# Patient Record
Sex: Male | Born: 1980 | Hispanic: Yes | Marital: Single | Smoking: Never smoker
Health system: Southern US, Community
[De-identification: ages and names within clinical notes are randomized; demographics above are authoritative.]

## PROBLEM LIST (undated history)

## (undated) DIAGNOSIS — K759 Inflammatory liver disease, unspecified: Secondary | ICD-10-CM

## (undated) DIAGNOSIS — K760 Fatty (change of) liver, not elsewhere classified: Secondary | ICD-10-CM

## (undated) HISTORY — DX: Fatty (change of) liver, not elsewhere classified: K76.0

## (undated) HISTORY — DX: Inflammatory liver disease, unspecified: K75.9

## (undated) HISTORY — PX: NO PAST SURGERIES: SHX2092

---

## 2020-02-03 ENCOUNTER — Ambulatory Visit: Payer: Self-pay | Attending: Internal Medicine

## 2020-02-03 DIAGNOSIS — Z23 Encounter for immunization: Secondary | ICD-10-CM

## 2020-02-03 NOTE — Progress Notes (Signed)
   Covid-19 Vaccination Clinic  Name:  Martin Hutchinson    MRN: [id number] DOB: Mar 06, 1981  02/03/2020  [name] was observed post Covid-19 immunization for 15 minutes without incident. He was provided with Vaccine Information Sheet and instruction to access the V-Safe system.   [name] was instructed to call 911 with any severe reactions post vaccine: [name] Kitchen Difficulty breathing  . Swelling of face and throat  . A fast heartbeat  . A bad rash all over body  . Dizziness and weakness   Immunizations Administered    Name Date Dose VIS Date Route   Pfizer COVID-19 Vaccine 02/03/2020 10:01 AM 0.3 mL 10/07/2019 Intramuscular   Manufacturer: [employer], [employer]   Lot: [id number]   NDC: [address]-7

## 2020-02-23 ENCOUNTER — Ambulatory Visit: Payer: Managed Care, Other (non HMO) | Admitting: Internal Medicine

## 2020-02-23 ENCOUNTER — Encounter: Payer: Self-pay | Admitting: Internal Medicine

## 2020-02-23 VITALS — BP 120/80 | HR 97 | Temp 98.1°F | Ht 66.0 in | Wt 152.4 lb

## 2020-02-23 DIAGNOSIS — Z0000 Encounter for general adult medical examination without abnormal findings: Secondary | ICD-10-CM | POA: Diagnosis not present

## 2020-02-23 NOTE — Patient Instructions (Signed)
-Nice seeing you today!!  -Return fasting for your lab work.  -Schedule follow up in 1 year or sooner as needed.   Preventive Care 97-41 Years Old, Male Preventive care refers to lifestyle choices and visits with your health care provider that can promote health and wellness. This includes:  A yearly physical exam. This is also called an annual well check.  Regular dental and eye exams.  Immunizations.  Screening for certain conditions.  Healthy lifestyle choices, such as eating a healthy diet, getting regular exercise, not using drugs or products that contain nicotine and tobacco, and limiting alcohol use. What can I expect for my preventive care visit? Physical exam Your health care provider will check:  Height and weight. These may be used to calculate body mass index (BMI), which is a measurement that tells if you are at a healthy weight.  Heart rate and blood pressure.  Your skin for abnormal spots. Counseling Your health care provider may ask you questions about:  Alcohol, tobacco, and drug use.  Emotional well-being.  Home and relationship well-being.  Sexual activity.  Eating habits.  Work and work Statistician. What immunizations do I need?  Influenza (flu) vaccine  This is recommended every year. Tetanus, diphtheria, and pertussis (Tdap) vaccine  You may need a Td booster every 10 years. Varicella (chickenpox) vaccine  You may need this vaccine if you have not already been vaccinated. Zoster (shingles) vaccine  You may need this after age 8. Measles, mumps, and rubella (MMR) vaccine  You may need at least one dose of MMR if you were born in 1957 or later. You may also need a second dose. Pneumococcal conjugate (PCV13) vaccine  You may need this if you have certain conditions and were not previously vaccinated. Pneumococcal polysaccharide (PPSV23) vaccine  You may need one or two doses if you smoke cigarettes or if you have certain  conditions. Meningococcal conjugate (MenACWY) vaccine  You may need this if you have certain conditions. Hepatitis A vaccine  You may need this if you have certain conditions or if you travel or work in places where you may be exposed to hepatitis A. Hepatitis B vaccine  You may need this if you have certain conditions or if you travel or work in places where you may be exposed to hepatitis B. Haemophilus influenzae type b (Hib) vaccine  You may need this if you have certain risk factors. Human papillomavirus (HPV) vaccine  If recommended by your health care provider, you may need three doses over 6 months. You may receive vaccines as individual doses or as more than one vaccine together in one shot (combination vaccines). Talk with your health care provider about the risks and benefits of combination vaccines. What tests do I need? Blood tests  Lipid and cholesterol levels. These may be checked every 5 years, or more frequently if you are over 44 years old.  Hepatitis C test.  Hepatitis B test. Screening  Lung cancer screening. You may have this screening every year starting at age 34 if you have a 30-pack-year history of smoking and currently smoke or have quit within the past 15 years.  Prostate cancer screening. Recommendations will vary depending on your family history and other risks.  Colorectal cancer screening. All adults should have this screening starting at age 70 and continuing until age 69. Your health care provider may recommend screening at age 73 if you are at increased risk. You will have tests every 1-10 years, depending on your  results and the type of screening test.  Diabetes screening. This is done by checking your blood sugar (glucose) after you have not eaten for a while (fasting). You may have this done every 1-3 years.  Sexually transmitted disease (STD) testing. Follow these instructions at home: Eating and drinking  Eat a diet that includes fresh  fruits and vegetables, whole grains, lean protein, and low-fat dairy products.  Take vitamin and mineral supplements as recommended by your health care provider.  Do not drink alcohol if your health care provider tells you not to drink.  If you drink alcohol: ? Limit how much you have to 0-2 drinks a day. ? Be aware of how much alcohol is in your drink. In the [place] one drink equals one 12 oz bottle of beer (355 mL), one 5 oz glass of wine (148 mL), or one 1 oz glass of hard liquor (44 mL). Lifestyle  Take daily care of your teeth and gums.  Stay active. Exercise for at least 30 minutes on 5 or more days each week.  Do not use any products that contain nicotine or tobacco, such as cigarettes, e-cigarettes, and chewing tobacco. If you need help quitting, ask your health care provider.  If you are sexually active, practice safe sex. Use a condom or other form of protection to prevent STIs (sexually transmitted infections).  Talk with your health care provider about taking a low-dose aspirin every day starting at age 76. What's next?  Go to your health care provider once a year for a well check visit.  Ask your health care provider how often you should have your eyes and teeth checked.  Stay up to date on all vaccines. This information is not intended to replace advice given to you by your health care provider. Make sure you discuss any questions you have with your health care provider. Document Revised: 10/07/2018 Document Reviewed: 10/07/2018 Elsevier Patient Education  2020 [employer].

## 2020-02-23 NOTE — Progress Notes (Signed)
New Patient Office Visit     This visit occurred during the SARS-CoV-2 public health emergency.  Safety protocols were in place, including screening questions prior to the visit, additional usage of staff PPE, and extensive cleaning of exam room while observing appropriate contact time as indicated for disinfecting solutions.    CC/Reason for Visit: Establish care, annual preventive exam Previous PCP: None Last Visit: Unknown  HPI: Martin Hutchinson is a 39 y.o. male who is coming in today for the above mentioned reasons.  He has no past medical history of significance.  He moved from [place] 2 years ago.  He works as an Licensed conveyancer.  He does not smoke, he does not drink, he does not have any known drug allergies, he has not had any past surgeries, does not take any medications, has not been diagnosed with any medical illnesses.  His family history significant for mother unfortunately died from multiple myeloma last year.  He received his first Covid vaccine.  He had a tetanus in 2018.  Has never had a colonoscopy.   Past Medical/Surgical History: History reviewed. No pertinent past medical history.  History reviewed. No pertinent surgical history.  Social History:  reports that he has never smoked. He has never used smokeless tobacco. He reports previous alcohol use. He reports that he does not use drugs.  Allergies: No Known Allergies  Family History:  Family History  Problem Relation Age of Onset  . Multiple myeloma Mother     No current outpatient medications on file.  Review of Systems:  Constitutional: Denies fever, chills, diaphoresis, appetite change and fatigue.  HEENT: Denies photophobia, eye pain, redness, hearing loss, ear pain, congestion, sore throat, rhinorrhea, sneezing, mouth sores, trouble swallowing, neck pain, neck stiffness and tinnitus.   Respiratory: Denies SOB, DOE, cough, chest tightness,  and wheezing.   Cardiovascular: Denies chest pain,  palpitations and leg swelling.  Gastrointestinal: Denies nausea, vomiting, abdominal pain, diarrhea, constipation, blood in stool and abdominal distention.  Genitourinary: Denies dysuria, urgency, frequency, hematuria, flank pain and difficulty urinating.  Endocrine: Denies: hot or cold intolerance, sweats, changes in hair or nails, polyuria, polydipsia. Musculoskeletal: Denies myalgias, back pain, joint swelling, arthralgias and gait problem.  Skin: Denies pallor, rash and wound.  Neurological: Denies dizziness, seizures, syncope, weakness, light-headedness, numbness and headaches.  Hematological: Denies adenopathy. Easy bruising, personal or family bleeding history  Psychiatric/Behavioral: Denies suicidal ideation, mood changes, confusion, nervousness, sleep disturbance and agitation    Physical Exam: Vitals:   02/23/20 0851  BP: 120/80  Pulse: 97  Temp: 98.1 F (36.7 C)  TempSrc: Temporal  SpO2: 96%  Weight: 152 lb 6.4 oz (69.1 kg)  Height: 5' 6"  (1.676 m)   Body mass index is 24.6 kg/m.  Constitutional: NAD, calm, comfortable Eyes: PERRL, lids and conjunctivae normal ENMT: Mucous membranes are moist. Tympanic membrane is pearly white, no erythema or bulging. Neck: normal, supple, no masses, no thyromegaly Respiratory: clear to auscultation bilaterally, no wheezing, no crackles. Normal respiratory effort. No accessory muscle use.  Cardiovascular: Regular rate and rhythm, no murmurs / rubs / gallops. No extremity edema. 2+ pedal pulses. No carotid bruits.  Abdomen: no tenderness, no masses palpated. No hepatosplenomegaly. Bowel sounds positive.  Musculoskeletal: no clubbing / cyanosis. No joint deformity upper and lower extremities. Good ROM, no contractures. Normal muscle tone.  Skin: no rashes, lesions, ulcers. No induration Neurologic: CN 2-12 grossly intact. Sensation intact, DTR normal. Strength 5/5 in all 4.  Psychiatric: Normal judgment and  insight. Alert and oriented x  3. Normal mood.    Impression and Plan:  Encounter for preventive health examination -Advised routine eye and dental care. -Immunizations are up-to-date, he is getting his second Covid vaccine next week. -Screening labs today. -Healthy lifestyle discussed in detail. -Commence routine colon cancer screening at age 29. -PSA today.    Patient Instructions  -Nice seeing you today!!  -Return fasting for your lab work.  -Schedule follow up in 1 year or sooner as needed.   Preventive Care 49-39 Years Old, Male Preventive care refers to lifestyle choices and visits with your health care provider that can promote health and wellness. This includes:  A yearly physical exam. This is also called an annual well check.  Regular dental and eye exams.  Immunizations.  Screening for certain conditions.  Healthy lifestyle choices, such as eating a healthy diet, getting regular exercise, not using drugs or products that contain nicotine and tobacco, and limiting alcohol use. What can I expect for my preventive care visit? Physical exam Your health care provider will check:  Height and weight. These may be used to calculate body mass index (BMI), which is a measurement that tells if you are at a healthy weight.  Heart rate and blood pressure.  Your skin for abnormal spots. Counseling Your health care provider may ask you questions about:  Alcohol, tobacco, and drug use.  Emotional well-being.  Home and relationship well-being.  Sexual activity.  Eating habits.  Work and work Statistician. What immunizations do I need?  Influenza (flu) vaccine  This is recommended every year. Tetanus, diphtheria, and pertussis (Tdap) vaccine  You may need a Td booster every 10 years. Varicella (chickenpox) vaccine  You may need this vaccine if you have not already been vaccinated. Zoster (shingles) vaccine  You may need this after age 21. Measles, mumps, and rubella (MMR) vaccine   You may need at least one dose of MMR if you were born in 1957 or later. You may also need a second dose. Pneumococcal conjugate (PCV13) vaccine  You may need this if you have certain conditions and were not previously vaccinated. Pneumococcal polysaccharide (PPSV23) vaccine  You may need one or two doses if you smoke cigarettes or if you have certain conditions. Meningococcal conjugate (MenACWY) vaccine  You may need this if you have certain conditions. Hepatitis A vaccine  You may need this if you have certain conditions or if you travel or work in places where you may be exposed to hepatitis A. Hepatitis B vaccine  You may need this if you have certain conditions or if you travel or work in places where you may be exposed to hepatitis B. Haemophilus influenzae type b (Hib) vaccine  You may need this if you have certain risk factors. Human papillomavirus (HPV) vaccine  If recommended by your health care provider, you may need three doses over 6 months. You may receive vaccines as individual doses or as more than one vaccine together in one shot (combination vaccines). Talk with your health care provider about the risks and benefits of combination vaccines. What tests do I need? Blood tests  Lipid and cholesterol levels. These may be checked every 5 years, or more frequently if you are over 14 years old.  Hepatitis C test.  Hepatitis B test. Screening  Lung cancer screening. You may have this screening every year starting at age 66 if you have a 30-pack-year history of smoking and currently smoke or have quit within the  past 15 years.  Prostate cancer screening. Recommendations will vary depending on your family history and other risks.  Colorectal cancer screening. All adults should have this screening starting at age 47 and continuing until age 16. Your health care provider may recommend screening at age 39 if you are at increased risk. You will have tests every 1-10 years,  depending on your results and the type of screening test.  Diabetes screening. This is done by checking your blood sugar (glucose) after you have not eaten for a while (fasting). You may have this done every 1-3 years.  Sexually transmitted disease (STD) testing. Follow these instructions at home: Eating and drinking  Eat a diet that includes fresh fruits and vegetables, whole grains, lean protein, and low-fat dairy products.  Take vitamin and mineral supplements as recommended by your health care provider.  Do not drink alcohol if your health care provider tells you not to drink.  If you drink alcohol: ? Limit how much you have to 0-2 drinks a day. ? Be aware of how much alcohol is in your drink. In the [place] one drink equals one 12 oz bottle of beer (355 mL), one 5 oz glass of wine (148 mL), or one 1 oz glass of hard liquor (44 mL). Lifestyle  Take daily care of your teeth and gums.  Stay active. Exercise for at least 30 minutes on 5 or more days each week.  Do not use any products that contain nicotine or tobacco, such as cigarettes, e-cigarettes, and chewing tobacco. If you need help quitting, ask your health care provider.  If you are sexually active, practice safe sex. Use a condom or other form of protection to prevent STIs (sexually transmitted infections).  Talk with your health care provider about taking a low-dose aspirin every day starting at age 72. What's next?  Go to your health care provider once a year for a well check visit.  Ask your health care provider how often you should have your eyes and teeth checked.  Stay up to date on all vaccines. This information is not intended to replace advice given to you by your health care provider. Make sure you discuss any questions you have with your health care provider. Document Revised: 10/07/2018 Document Reviewed: 10/07/2018 Elsevier Patient Education  2020 [place], [place]  [facility]

## 2020-02-24 ENCOUNTER — Encounter: Payer: Self-pay | Admitting: Internal Medicine

## 2020-02-24 ENCOUNTER — Other Ambulatory Visit (INDEPENDENT_AMBULATORY_CARE_PROVIDER_SITE_OTHER): Payer: Managed Care, Other (non HMO)

## 2020-02-24 ENCOUNTER — Other Ambulatory Visit: Payer: Self-pay

## 2020-02-24 ENCOUNTER — Other Ambulatory Visit: Payer: Self-pay | Admitting: Internal Medicine

## 2020-02-24 DIAGNOSIS — R7989 Other specified abnormal findings of blood chemistry: Secondary | ICD-10-CM

## 2020-02-24 DIAGNOSIS — E559 Vitamin D deficiency, unspecified: Secondary | ICD-10-CM | POA: Insufficient documentation

## 2020-02-24 DIAGNOSIS — Z0000 Encounter for general adult medical examination without abnormal findings: Secondary | ICD-10-CM

## 2020-02-24 DIAGNOSIS — Z125 Encounter for screening for malignant neoplasm of prostate: Secondary | ICD-10-CM

## 2020-02-24 DIAGNOSIS — R946 Abnormal results of thyroid function studies: Secondary | ICD-10-CM | POA: Diagnosis not present

## 2020-02-24 LAB — CBC WITH DIFFERENTIAL/PLATELET
Basophils Absolute: 0 10*3/uL (ref 0.0–0.1)
Basophils Relative: 0.4 % (ref 0.0–3.0)
Eosinophils Absolute: 0.1 10*3/uL (ref 0.0–0.7)
Eosinophils Relative: 2.9 % (ref 0.0–5.0)
HCT: 46.8 % (ref 39.0–52.0)
Hemoglobin: 16.1 g/dL (ref 13.0–17.0)
Lymphocytes Relative: 37.4 % (ref 12.0–46.0)
Lymphs Abs: 1.9 10*3/uL (ref 0.7–4.0)
MCHC: 34.4 g/dL (ref 30.0–36.0)
MCV: 90.8 fl (ref 78.0–100.0)
Monocytes Absolute: 0.3 10*3/uL (ref 0.1–1.0)
Monocytes Relative: 5.4 % (ref 3.0–12.0)
Neutro Abs: 2.8 10*3/uL (ref 1.4–7.7)
Neutrophils Relative %: 53.9 % (ref 43.0–77.0)
Platelets: 194 10*3/uL (ref 150.0–400.0)
RBC: 5.15 Mil/uL (ref 4.22–5.81)
RDW: 13.1 % (ref 11.5–15.5)
WBC: 5.1 10*3/uL (ref 4.0–10.5)

## 2020-02-24 LAB — COMPREHENSIVE METABOLIC PANEL
ALT: 38 U/L (ref 0–53)
AST: 28 U/L (ref 0–37)
Albumin: 4.8 g/dL (ref 3.5–5.2)
Alkaline Phosphatase: 107 U/L (ref 39–117)
BUN: 12 mg/dL (ref 6–23)
CO2: 28 mEq/L (ref 19–32)
Calcium: 9.3 mg/dL (ref 8.4–10.5)
Chloride: 106 mEq/L (ref 96–112)
Creatinine, Ser: 0.85 mg/dL (ref 0.40–1.50)
GFR: 99.26 mL/min (ref >60.00)
Glucose, Bld: 96 mg/dL (ref 70–99)
Potassium: 4 mEq/L (ref 3.5–5.1)
Sodium: 143 mEq/L (ref 135–145)
Total Bilirubin: 1.9 mg/dL — ABNORMAL HIGH (ref 0.2–1.2)
Total Protein: 7.3 g/dL (ref 6.0–8.3)

## 2020-02-24 LAB — VITAMIN B12: Vitamin B-12: 352 pg/mL (ref 211–911)

## 2020-02-24 LAB — LIPID PANEL
Cholesterol: 180 mg/dL (ref 0–200)
HDL: 29 mg/dL — ABNORMAL LOW (ref >39.00)
NonHDL: 150.81
Total CHOL/HDL Ratio: 6
Triglycerides: 202 mg/dL — ABNORMAL HIGH (ref 0.0–149.0)
VLDL: 40.4 mg/dL — ABNORMAL HIGH (ref 0.0–40.0)

## 2020-02-24 LAB — HEMOGLOBIN A1C: Hgb A1c MFr Bld: 5 % (ref 4.6–6.5)

## 2020-02-24 LAB — TSH: TSH: 4.54 u[IU]/mL — ABNORMAL HIGH (ref 0.35–4.50)

## 2020-02-24 LAB — PSA: PSA: 0.8 ng/mL (ref 0.10–4.00)

## 2020-02-24 LAB — T4, FREE: Free T4: 0.85 ng/dL (ref 0.60–1.60)

## 2020-02-24 LAB — LDL CHOLESTEROL, DIRECT: Direct LDL: 112 mg/dL

## 2020-02-24 LAB — T3, FREE: T3, Free: 3.6 pg/mL (ref 2.3–4.2)

## 2020-02-24 LAB — VITAMIN D 25 HYDROXY (VIT D DEFICIENCY, FRACTURES): VITD: 18.92 ng/mL — ABNORMAL LOW (ref 30.00–100.00)

## 2020-02-24 MED ORDER — VITAMIN D (ERGOCALCIFEROL) 1.25 MG (50000 UNIT) PO CAPS: 50000.0000 [IU] | ORAL_CAPSULE | ORAL | 0 refills | Status: AC

## 2020-02-27 ENCOUNTER — Ambulatory Visit: Payer: Managed Care, Other (non HMO) | Attending: Internal Medicine

## 2020-02-27 DIAGNOSIS — Z23 Encounter for immunization: Secondary | ICD-10-CM

## 2020-02-27 NOTE — Progress Notes (Signed)
   Covid-19 Vaccination Clinic  Name:  Martin Hutchinson    MRN: [id number] DOB: 07-28-1981  02/27/2020  Mr. [name] was observed post Covid-19 immunization for 15 minutes without incident. He was provided with Vaccine Information Sheet and instruction to access the V-Safe system.   Mr. [name] was instructed to call 911 with any severe reactions post vaccine: [name] Kitchen Difficulty breathing  . Swelling of face and throat  . A fast heartbeat  . A bad rash all over body  . Dizziness and weakness   Immunizations Administered    Name Date Dose VIS Date Route   Pfizer COVID-19 Vaccine 02/27/2020 10:31 AM 0.3 mL 12/21/2018 Intramuscular   Manufacturer: [employer], [employer]   Lot: [id number]   NDC: [address]-9

## 2020-02-29 ENCOUNTER — Other Ambulatory Visit: Payer: Self-pay | Admitting: Internal Medicine

## 2020-02-29 DIAGNOSIS — R7989 Other specified abnormal findings of blood chemistry: Secondary | ICD-10-CM

## 2020-05-11 NOTE — Addendum Note (Signed)
Addended by: Lerry Liner on: 05/11/2020 04:39 PM   Modules accepted: Orders

## 2020-05-12 ENCOUNTER — Other Ambulatory Visit: Payer: Self-pay | Admitting: Internal Medicine

## 2020-05-12 DIAGNOSIS — E559 Vitamin D deficiency, unspecified: Secondary | ICD-10-CM

## 2020-05-21 ENCOUNTER — Other Ambulatory Visit (INDEPENDENT_AMBULATORY_CARE_PROVIDER_SITE_OTHER): Payer: Managed Care, Other (non HMO)

## 2020-05-21 ENCOUNTER — Other Ambulatory Visit: Payer: Self-pay

## 2020-05-21 DIAGNOSIS — R7989 Other specified abnormal findings of blood chemistry: Secondary | ICD-10-CM

## 2020-05-21 DIAGNOSIS — E559 Vitamin D deficiency, unspecified: Secondary | ICD-10-CM

## 2020-05-21 LAB — VITAMIN D 25 HYDROXY (VIT D DEFICIENCY, FRACTURES): Vit D, 25-Hydroxy: 73 ng/mL (ref 30–100)

## 2020-05-21 LAB — T3, FREE: T3, Free: 3.2 pg/mL (ref 2.3–4.2)

## 2020-05-21 LAB — T4, FREE: Free T4: 1.2 ng/dL (ref 0.8–1.8)

## 2020-05-21 LAB — TSH: TSH: 4.36 mIU/L (ref 0.40–4.50)

## 2020-05-21 NOTE — Addendum Note (Signed)
Addended by: Lerry Liner on: 05/21/2020 08:40 AM   Modules accepted: Orders

## 2021-05-20 ENCOUNTER — Other Ambulatory Visit: Payer: Self-pay

## 2021-05-20 ENCOUNTER — Encounter: Payer: Self-pay | Admitting: Internal Medicine

## 2021-05-20 ENCOUNTER — Ambulatory Visit: Payer: BC Managed Care – PPO | Attending: Internal Medicine | Admitting: Internal Medicine

## 2021-05-20 VITALS — Ht 66.0 in | Wt 152.0 lb

## 2021-05-20 DIAGNOSIS — Z7689 Persons encountering health services in other specified circumstances: Secondary | ICD-10-CM | POA: Diagnosis not present

## 2021-05-20 NOTE — Progress Notes (Signed)
Virtual Visit via Video Note  I connected with Martin Hutchinson on 05/20/21 at  1:50 PM EDT by a video enabled telemedicine application and verified that I am speaking with the correct person using two identifiers.  Location: Patient: home Provider: office   I discussed the limitations of evaluation and management by telemedicine and the availability of in person appointments. The patient expressed understanding and agreed to proceed.  History of Present Illness: This is the first time visit for this patient who is wanting to establish care with [place].  Previous PCP was Dr. [name] with [facility].  He requested a change because her practice does not take his new insurance.  Patient denies any chronic medical issues.  He is not on any chronic medications. He denies any issues at this time. Denies any headaches, dizziness, chest pains, shortness of breath.  No lower extremity edema.  He reports healthy eating habits.  He incorporates fresh fruits and vegetables into his diet daily.  Does not eat a lot of greasy foods.  He drinks mainly water.  He exercises every other day by running for 20 to 30 minutes. He is wanting to set up an appointment to have a physical done.  Family history: Mother with multiple myeloma.  She is deceased.   Social history: He does not smoke or use street drugs.  He drinks alcoholic beverages occasionally.  He has a bachelor's degree in Market researcher.  He designs cars.  He is single.  He does not have any children.   Observations/Objective: Vitals:   05/20/21 1405  Height: _0  (1.676 m)  Weight: 152 lb (68.9 kg)  BMI (Calculated): 24.55     Assessment and Plan: 1. Establishing care with new doctor, encounter for Patient seems to be a healthy adult.  We will go ahead and have him schedule to come in and have his yearly physical.  Encouraged him to continue healthy eating habits and regular exercise.  Advised that the goal for exercise is 150 minutes/week of moderate  intensity exercise.   Follow Up Instructions: 7 wks for physical   I discussed the assessment and treatment plan with the patient. The patient was provided an opportunity to ask questions and all were answered. The patient agreed with the plan and demonstrated an understanding of the instructions.   The patient was advised to call back or seek an in-person evaluation if the symptoms worsen or if the condition fails to improve as anticipated.  I spent 10 minutes dedicated to the care of this patient on the date of this encounter to include previsit review of his chart, face-to-face time with this patient and counseling on healthy lifestyle.   Karle Plumber, MD

## 2021-05-27 NOTE — Addendum Note (Signed)
Addended by: Jonah Blue B on: 05/27/2021 11:27 PM   Modules accepted: Level of Service

## 2021-07-19 ENCOUNTER — Encounter: Payer: BC Managed Care – PPO | Admitting: Internal Medicine

## 2021-08-20 ENCOUNTER — Other Ambulatory Visit: Payer: Self-pay

## 2021-08-20 ENCOUNTER — Ambulatory Visit: Payer: BC Managed Care – PPO | Attending: Internal Medicine | Admitting: Internal Medicine

## 2021-08-20 ENCOUNTER — Encounter: Payer: Self-pay | Admitting: Internal Medicine

## 2021-08-20 VITALS — BP 124/80 | HR 79 | Resp 16 | Ht 65.0 in | Wt 162.8 lb

## 2021-08-20 DIAGNOSIS — Z5320 Procedure and treatment not carried out because of patient's decision for unspecified reasons: Secondary | ICD-10-CM

## 2021-08-20 DIAGNOSIS — E663 Overweight: Secondary | ICD-10-CM | POA: Diagnosis not present

## 2021-08-20 DIAGNOSIS — H6123 Impacted cerumen, bilateral: Secondary | ICD-10-CM

## 2021-08-20 DIAGNOSIS — Z2821 Immunization not carried out because of patient refusal: Secondary | ICD-10-CM

## 2021-08-20 DIAGNOSIS — Z0000 Encounter for general adult medical examination without abnormal findings: Secondary | ICD-10-CM

## 2021-08-20 DIAGNOSIS — R030 Elevated blood-pressure reading, without diagnosis of hypertension: Secondary | ICD-10-CM

## 2021-08-20 NOTE — Progress Notes (Signed)
Patient ID: Martin Hutchinson, male    DOB: 26-Jan-1981  MRN: [id number]  CC: Annual Exam   Subjective: Martin Hutchinson is a 40 y.o. male who presents for annual exam His concerns today include:   BP elev today. No prior issues. No HA, dizziness, CP or SOB  BMI is 27 He runs and walks 3-4 days a wk for 1 hr.  Did not get in as much exercise over past mth because he had family visiting from out of town..  Works from home where he sits all day. Feels he does well with eating habits.  He tries to limit his intake of white carbohydrates and incorporate fresh fruits and vegetables into the diet.  HM:  declines HIV and hep c screening.  He does not feel he is at increased risk for either.  Declines flu shot. Patient Active Problem List   Diagnosis Date Noted   Vitamin D deficiency 02/24/2020   Abnormal TSH 02/24/2020     No current outpatient medications on file prior to visit.   No current facility-administered medications on file prior to visit.    No Known Allergies  Social History   Socioeconomic History   Marital status: Single    Spouse name: Not on file   Number of children: 0   Years of education: Not on file   Highest education level: Bachelor's degree (e.g., BA, AB, BS)  Occupational History   Not on file  Tobacco Use   Smoking status: Never   Smokeless tobacco: Never  Vaping Use   Vaping Use: Never used  Substance and Sexual Activity   Alcohol use: Not Currently   Drug use: Never   Sexual activity: Not on file  Other Topics Concern   Not on file  Social History Narrative   Not on file   Social Determinants of Health   Financial Resource Strain: Not on file  Food Insecurity: Not on file  Transportation Needs: Not on file  Physical Activity: Not on file  Stress: Not on file  Social Connections: Not on file  Intimate Partner Violence: Not on file    Family History  Problem Relation Age of Onset   Multiple myeloma Mother     Past Surgical History:   Procedure Laterality Date   NO PAST SURGERIES      ROS: Review of Systems  Constitutional:  Negative for appetite change, fatigue and fever.  HENT:  Negative for congestion, dental problem, hearing loss, rhinorrhea and sore throat.   Eyes:        Wears glasses for distance.  Last eye exam was last year  Respiratory:  Negative for cough and shortness of breath.   Cardiovascular:  Negative for chest pain and leg swelling.  Gastrointestinal:        Moving bowels okay.  Genitourinary:  Negative for difficulty urinating and dysuria.  Musculoskeletal:  Negative for arthralgias and joint swelling.  Skin:  Negative for rash.   PHYSICAL EXAM: BP 124/80   Pulse 79   Resp 16   Ht 5' 5"  (1.651 m)   Wt 162 lb 12.8 oz (73.8 kg)   SpO2 97%   BMI 27.09 kg/m   Physical Exam  General appearance - alert, well appearing, and in no distress Mental status - normal mood, behavior, speech, dress, motor activity, and thought processes Eyes - pupils equal and reactive, extraocular eye movements intact Ears -impacted cerumen in both ears obscuring view of the eardrum right greater than left. Nose -  normal and patent, no erythema, discharge or polyps Mouth - mucous membranes moist, pharynx normal without lesions Neck - supple, no significant adenopathy Lymphatics - no palpable lymphadenopathy, no hepatosplenomegaly Chest - clear to auscultation, no wheezes, rales or rhonchi, symmetric air entry Heart - normal rate, regular rhythm, normal S1, S2, no murmurs, rubs, clicks or gallops Abdomen - soft, nontender, nondistended, no masses or organomegaly Neurological - cranial nerves II through XII intact, motor and sensory grossly normal bilaterally Musculoskeletal - no joint tenderness, deformity or swelling Extremities - peripheral pulses normal, no pedal edema, no clubbing or cyanosis Depression screen [facility] 2/9 08/20/2021 02/23/2020  Decreased Interest 0 0  Down, Depressed, Hopeless 0 0  PHQ - 2 Score  0 0  Altered sleeping - 0  Tired, decreased energy - 0  Change in appetite - 0  Feeling bad or failure about yourself  - 0  Trouble concentrating - 0  Moving slowly or fidgety/restless - 0  Suicidal thoughts - 0  PHQ-9 Score - 0  Difficult doing work/chores - Not difficult at all    CMP Latest Ref Rng & Units 02/24/2020  Glucose 70 - 99 mg/dL 96  BUN 6 - 23 mg/dL 12  Creatinine 0.40 - 1.50 mg/dL 0.85  Sodium 135 - 145 mEq/L 143  Potassium 3.5 - 5.1 mEq/L 4.0  Chloride 96 - 112 mEq/L 106  CO2 19 - 32 mEq/L 28  Calcium 8.4 - 10.5 mg/dL 9.3  Total Protein 6.0 - 8.3 g/dL 7.3  Total Bilirubin 0.2 - 1.2 mg/dL 1.9(H)  Alkaline Phos 39 - 117 U/L 107  AST 0 - 37 U/L 28  ALT 0 - 53 U/L 38   Lipid Panel     Component Value Date/Time   CHOL 180 02/24/2020 0858   TRIG 202.0 (H) 02/24/2020 0858   HDL 29.00 (L) 02/24/2020 0858   CHOLHDL 6 02/24/2020 0858   VLDL 40.4 (H) 02/24/2020 0858   LDLDIRECT 112.0 02/24/2020 0858    CBC    Component Value Date/Time   WBC 5.1 02/24/2020 0858   RBC 5.15 02/24/2020 0858   HGB 16.1 02/24/2020 0858   HCT 46.8 02/24/2020 0858   PLT 194.0 02/24/2020 0858   MCV 90.8 02/24/2020 0858   MCHC 34.4 02/24/2020 0858   RDW 13.1 02/24/2020 0858   LYMPHSABS 1.9 02/24/2020 0858   MONOABS 0.3 02/24/2020 0858   EOSABS 0.1 02/24/2020 0858   BASOSABS 0.0 02/24/2020 0858    ASSESSMENT AND PLAN: 1. Annual physical exam Overall healthy adult.  Printed information given on preventative maintenance required for males his age.  2. Overweight (BMI 25.0-29.9) Encouraged him to continue healthy eating habits.  Advised to avoid sugary drinks and to drink more water, continue to incorporate fresh fruits and vegetables into the diet, decrease portion sizes of white carbohydrates and eat more lean meat like chicken [place] and seafood instead of beef or pork.  Encouraged him to get back into his exercise routine which is more than adequate. - CBC - Comprehensive  metabolic panel - Lipid panel - Hemoglobin A1c  3. Impacted cerumen of both ears Advised to purchase Debrox or Cerumenex over-the-counter and use it in both ears to soften up the wax.  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  4. Prehypertension DASH diet discussed and encouraged.  5. Influenza vaccination declined Recommended.  Patient declined.  6. HIV screening declined   7. Screening for hepatitis C declined  Patient was given the opportunity to ask questions.  Patient verbalized understanding of the plan and was able to repeat key elements of the plan.   Orders Placed This Encounter  Procedures   CBC   Comprehensive metabolic panel   Lipid panel   Hemoglobin A1c     Requested Prescriptions    No prescriptions requested or ordered in this encounter    Return if symptoms worsen or fail to improve.  Karle Plumber, MD, FACP

## 2021-08-20 NOTE — Patient Instructions (Signed)
You can purchase Cerumenex or Debrox over the counter and use it in both ears to help soften the wax.    Preventive Care 61-42 Years Old, Male Preventive care refers to lifestyle choices and visits with your health care provider that can promote health and wellness. This includes: A yearly physical exam. This is also called an annual wellness visit. Regular dental and eye exams. Immunizations. Screening for certain conditions. Healthy lifestyle choices, such as: Eating a healthy diet. Getting regular exercise. Not using drugs or products that contain nicotine and tobacco. Limiting alcohol use. What can I expect for my preventive care visit? Physical exam Your health care provider will check your: Height and weight. These may be used to calculate your BMI (body mass index). BMI is a measurement that tells if you are at a healthy weight. Heart rate and blood pressure. Body temperature. Skin for abnormal spots. Counseling Your health care provider may ask you questions about your: Past medical problems. Family's medical history. Alcohol, tobacco, and drug use. Emotional well-being. Home life and relationship well-being. Sexual activity. Diet, exercise, and sleep habits. Work and work Astronomer. Access to firearms. What immunizations do I need? Vaccines are usually given at various ages, according to a schedule. Your health care provider will recommend vaccines for you based on your age, medical history, and lifestyle or other factors, such as travel or where you work. What tests do I need? Blood tests Lipid and cholesterol levels. These may be checked every 5 years, or more often if you are over 42 years old. Hepatitis C test. Hepatitis B test. Screening Lung cancer screening. You may have this screening every year starting at age 42 if you have a 30-pack-year history of smoking and currently smoke or have quit within the past 15 years. Prostate cancer screening.  Recommendations will vary depending on your family history and other risks. Genital exam to check for testicular cancer or hernias. Colorectal cancer screening. All adults should have this screening starting at age 42 and continuing until age 8. Your health care provider may recommend screening at age 42 if you are at increased risk. You will have tests every 1-10 years, depending on your results and the type of screening test. Diabetes screening. This is done by checking your blood sugar (glucose) after you have not eaten for a while (fasting). You may have this done every 1-3 years. STD (sexually transmitted disease) testing, if you are at risk. Follow these instructions at home: Eating and drinking  Eat a diet that includes fresh fruits and vegetables, whole grains, lean protein, and low-fat dairy products. Take vitamin and mineral supplements as recommended by your health care provider. Do not drink alcohol if your health care provider tells you not to drink. If you drink alcohol: Limit how much you have to 0-2 drinks a day. Be aware of how much alcohol is in your drink. In the [place] one drink equals one 12 oz bottle of beer (355 mL), one 5 oz glass of wine (148 mL), or one 1 oz glass of hard liquor (44 mL). Lifestyle Take daily care of your teeth and gums. Brush your teeth every morning and night with fluoride toothpaste. Floss one time each day. Stay active. Exercise for at least 30 minutes 5 or more days each week. Do not use any products that contain nicotine or tobacco, such as cigarettes, e-cigarettes, and chewing tobacco. If you need help quitting, ask your health care provider. Do not use drugs. If you are  sexually active, practice safe sex. Use a condom or other form of protection to prevent STIs (sexually transmitted infections). If told by your health care provider, take low-dose aspirin daily starting at age 42. Find healthy ways to cope with stress, such as: Meditation,  yoga, or listening to music. Journaling. Talking to a trusted person. Spending time with friends and family. Safety Always wear your seat belt while driving or riding in a vehicle. Do not drive: If you have been drinking alcohol. Do not ride with someone who has been drinking. When you are tired or distracted. While texting. Wear a helmet and other protective equipment during sports activities. If you have firearms in your house, make sure you follow all gun safety procedures. What's next? Go to your health care provider once a year for an annual wellness visit. Ask your health care provider how often you should have your eyes and teeth checked. Stay up to date on all vaccines. This information is not intended to replace advice given to you by your health care provider. Make sure you discuss any questions you have with your health care provider. Document Revised: 12/21/2020 Document Reviewed: 10/07/2018 Elsevier Patient Education  2022 [employer].

## 2021-08-21 ENCOUNTER — Other Ambulatory Visit: Payer: Self-pay | Admitting: Internal Medicine

## 2021-08-21 DIAGNOSIS — R7989 Other specified abnormal findings of blood chemistry: Secondary | ICD-10-CM

## 2021-08-21 LAB — LIPID PANEL
Chol/HDL Ratio: 7 ratio — ABNORMAL HIGH (ref 0.0–5.0)
Cholesterol, Total: 182 mg/dL (ref 100–199)
HDL: 26 mg/dL — ABNORMAL LOW (ref >39)
LDL Chol Calc (NIH): 126 mg/dL — ABNORMAL HIGH (ref 0–99)
Triglycerides: 166 mg/dL — ABNORMAL HIGH (ref 0–149)
VLDL Cholesterol Cal: 30 mg/dL (ref 5–40)

## 2021-08-21 LAB — COMPREHENSIVE METABOLIC PANEL
ALT: 100 IU/L — ABNORMAL HIGH (ref 0–44)
AST: 47 IU/L — ABNORMAL HIGH (ref 0–40)
Albumin/Globulin Ratio: 1.7 (ref 1.2–2.2)
Albumin: 4.7 g/dL (ref 4.0–5.0)
Alkaline Phosphatase: 127 IU/L — ABNORMAL HIGH (ref 44–121)
BUN/Creatinine Ratio: 14 (ref 9–20)
BUN: 12 mg/dL (ref 6–24)
Bilirubin Total: 1.6 mg/dL — ABNORMAL HIGH (ref 0.0–1.2)
CO2: 21 mmol/L (ref 20–29)
Calcium: 9.2 mg/dL (ref 8.7–10.2)
Chloride: 105 mmol/L (ref 96–106)
Creatinine, Ser: 0.88 mg/dL (ref 0.76–1.27)
Globulin, Total: 2.7 g/dL (ref 1.5–4.5)
Glucose: 102 mg/dL — ABNORMAL HIGH (ref 70–99)
Potassium: 4.2 mmol/L (ref 3.5–5.2)
Sodium: 142 mmol/L (ref 134–144)
Total Protein: 7.4 g/dL (ref 6.0–8.5)
eGFR: 110 mL/min/{1.73_m2} (ref >59)

## 2021-08-21 LAB — CBC
Hematocrit: 49 % (ref 37.5–51.0)
Hemoglobin: 16.5 g/dL (ref 13.0–17.7)
MCH: 31.2 pg (ref 26.6–33.0)
MCHC: 33.7 g/dL (ref 31.5–35.7)
MCV: 93 fL (ref 79–97)
Platelets: 211 10*3/uL (ref 150–450)
RBC: 5.29 x10E6/uL (ref 4.14–5.80)
RDW: 13 % (ref 11.6–15.4)
WBC: 5.2 10*3/uL (ref 3.4–10.8)

## 2021-08-21 LAB — HEMOGLOBIN A1C
Est. average glucose Bld gHb Est-mCnc: 103 mg/dL
Hgb A1c MFr Bld: 5.2 % (ref 4.8–5.6)

## 2021-08-23 ENCOUNTER — Other Ambulatory Visit: Payer: Self-pay

## 2021-08-23 ENCOUNTER — Ambulatory Visit: Payer: BC Managed Care – PPO | Attending: Internal Medicine

## 2021-08-23 DIAGNOSIS — R7989 Other specified abnormal findings of blood chemistry: Secondary | ICD-10-CM

## 2021-08-25 ENCOUNTER — Other Ambulatory Visit: Payer: Self-pay | Admitting: Internal Medicine

## 2021-08-25 ENCOUNTER — Encounter: Payer: Self-pay | Admitting: Internal Medicine

## 2021-08-25 DIAGNOSIS — R7989 Other specified abnormal findings of blood chemistry: Secondary | ICD-10-CM

## 2021-08-25 LAB — GAMMA GT: GGT: 43 IU/L (ref 0–65)

## 2021-08-25 LAB — HCV AB W REFLEX TO QUANT PCR: HCV Ab: <0.1 s/co ratio (ref 0.0–0.9)

## 2021-08-25 LAB — HEPATITIS B SURFACE ANTIBODY, QUANTITATIVE: Hepatitis B Surf Ab Quant: 12.9 m[IU]/mL (ref >9.9)

## 2021-08-25 LAB — HEPATITIS A ANTIBODY, IGM: Hep A IgM: NEGATIVE

## 2021-08-25 LAB — HCV INTERPRETATION

## 2021-08-25 LAB — HEPATITIS B SURFACE ANTIGEN: Hepatitis B Surface Ag: NEGATIVE

## 2021-08-25 LAB — MITOCHONDRIAL ANTIBODIES: Mitochondrial Ab: <20 Units (ref 0.0–20.0)

## 2021-08-27 ENCOUNTER — Encounter: Payer: Self-pay | Admitting: Nurse Practitioner

## 2021-09-13 NOTE — Progress Notes (Addendum)
09/13/2021 Martin Hutchinson [id number] 1981/02/18   CHIEF COMPLAINT: Elevated LFTs  HISTORY OF PRESENT ILLNESS:  Martin Hutchinson is a 40 year old male with no significant past medical history.  He speaks spanish therefore he is accompanied by a [place] interpretor.  However, he spoke accurate English throughout today's visit. He presents to our office today as referred by Dr. Karle Plumber for further evaluation regarding elevated LFTs.  He underwent laboratory studies at the time of his annual physical exam 08/20/2021 which identified elevated alk phos level 127.  Total bili 1.6.  AST 47.  ALT 100. Additional labs done 08/23/2021 showed a normal GGT level of 43, hepatitis A IgM negative, hepatitis B surface antigen negative, hepatitis surface antibody 12.9 consistent with immunity, hepatitis C antibody negative, AMA <20, cholesterol 182, LDL 126, HDL 26 and triglyceride level 166.  Normal CBC.   He denies any knowledge of ever receiving hepatitis A or hepatitis B vaccination.  He stated his mother told him he had hepatitis as a child when living in [place].  No known family history of liver disease or liver cancer.  He denies any antibiotics, new medications or herbal supplements over the past year.  Infrequent alcohol use, 3 drinks yearly or less.  No drug use.  He denies having any nausea or vomiting.  No heartburn or dysphagia.  His appetite is good.  No weight loss.  No jaundice or pruritus.  He is passing normal formed brown bowel movement daily.  No rectal bleeding or black stools.  No other complaints at this time.    Hepatic Function Latest Ref Rng & Units 08/20/2021 02/24/2020  Total Protein 6.0 - 8.5 g/dL 7.4 7.3  Albumin 4.0 - 5.0 g/dL 4.7 4.8  AST 0 - 40 IU/L 47(H) 28  ALT 0 - 44 IU/L 100(H) 38  Alk Phosphatase 44 - 121 IU/L 127(H) 107  Total Bilirubin 0.0 - 1.2 mg/dL 1.6(H) 1.9(H)    CBC Latest Ref Rng & Units 08/20/2021 02/24/2020  WBC 3.4 - 10.8 x10E3/uL 5.2 5.1   Hemoglobin 13.0 - 17.7 g/dL 16.5 16.1  Hematocrit 37.5 - 51.0 % 49.0 46.8  Platelets 150 - 450 x10E3/uL 211 194.0     CBC Latest Ref Rng & Units 08/20/2021 02/24/2020  WBC 3.4 - 10.8 x10E3/uL 5.2 5.1  Hemoglobin 13.0 - 17.7 g/dL 16.5 16.1  Hematocrit 37.5 - 51.0 % 49.0 46.8  Platelets 150 - 450 x10E3/uL 211 194.0     No past medical history on file. Past Surgical History:  Procedure Laterality Date   NO PAST SURGERIES     Social History: He is single.  He is a Electrical engineer.  Nonsmoker. He drinks one beer or one glass of wine 3 times yearly on holidays. No drug use.   Family History: Mother deceased age 56 with history of multiple myeloma. Father age 19 old age. No family history of esophageal, gastric or colon cancer.  No Known Allergies   No outpatient encounter medications on file as of 09/16/2021.   No facility-administered encounter medications on file as of 09/16/2021.   REVIEW OF SYSTEMS:  Gen: Denies fever, sweats or chills. No weight loss.  CV: Denies chest pain, palpitations or edema. Resp: Denies cough, shortness of breath of hemoptysis.  GI: See HPI. GU : Denies urinary burning, blood in urine, increased urinary frequency or incontinence.  No change in urine color. MS: Denies joint pain, muscles aches or weakness. Derm: Denies rash, itchiness, skin lesions or unhealing  ulcers. Psych: Denies depression, anxiety or memory loss. Heme: Denies bruising, bleeding. Neuro:  Denies headaches, dizziness or paresthesias. Endo:  Denies any problems with DM, thyroid or adrenal function.  PHYSICAL EXAM: BP 120/76 (BP Location: Left Arm, Patient Position: Sitting, Cuff Size: Normal)   Pulse 88   Ht 5' 5.25" (1.657 m) Comment: height measured without shoes  Wt 159 lb (72.1 kg)   BMI 26.26 kg/m   General: 40 year old male in no acute distress. Head: Normocephalic and atraumatic. Eyes:  Sclerae non-icteric, conjunctive pink. Ears: Normal auditory acuity. Mouth: Dentition  intact. No ulcers or lesions.  Neck: Supple, no lymphadenopathy or thyromegaly.  Lungs: Clear bilaterally to auscultation without wheezes, crackles or rhonchi. Heart: Regular rate and rhythm. No murmur, rub or gallop appreciated.  Abdomen: Soft, nontender, non distended. No masses. No hepatosplenomegaly. Normoactive bowel sounds x 4 quadrants.  Rectal: Deferred. Musculoskeletal: Symmetrical with no gross deformities. Skin: Warm and dry. No rash or lesions on visible extremities.  No jaundice. Extremities: No edema. Neurological: Alert oriented x 4, no focal deficits.  Psychological:  Alert and cooperative. Normal mood and affect.  ASSESSMENT AND PLAN:  71) 40 year old male with elevated LFTs. Hep C negative. Hep A IgM negative. Hepatitis B surface antigen negative with positive Hepatitis B surface antibodies  indicates past exposure to hepatitis B or prior vaccination. History of hepatitis as a child, further details unknown. No N/V, abdominal pain, fever or weight loss.  -RUQ sonogram to rule out gallstones, fatty liver, hepatoma  -Hepatic panel, Hep B core total antibody (if positive would indicate past hepatitis B), Hepatitis A total antibody (if negative he will need Hep A vaccination), ANA, SMA, IgG, A1AT, ceruloplasmin, iron and ferritin level -Further recommendations to be determined after the above lab and abdominal sonogram results reviewed  2) Hypercholesterolemia/triglyceridemia -Continue follow-up with PCP  Addendum: Hep B core total antibody was nonreactive. Hepatitis A total antibody positive. He does not require Hep A vaccination.     CC:  Ladell Pier, MD

## 2021-09-16 ENCOUNTER — Ambulatory Visit: Payer: BC Managed Care – PPO | Admitting: Nurse Practitioner

## 2021-09-16 ENCOUNTER — Other Ambulatory Visit (INDEPENDENT_AMBULATORY_CARE_PROVIDER_SITE_OTHER): Payer: BC Managed Care – PPO

## 2021-09-16 ENCOUNTER — Encounter: Payer: Self-pay | Admitting: Nurse Practitioner

## 2021-09-16 VITALS — BP 120/76 | HR 88 | Ht 65.25 in | Wt 159.0 lb

## 2021-09-16 DIAGNOSIS — R7989 Other specified abnormal findings of blood chemistry: Secondary | ICD-10-CM

## 2021-09-16 LAB — HEPATIC FUNCTION PANEL
ALT: 90 U/L — ABNORMAL HIGH (ref 0–53)
AST: 45 U/L — ABNORMAL HIGH (ref 0–37)
Albumin: 4.8 g/dL (ref 3.5–5.2)
Alkaline Phosphatase: 107 U/L (ref 39–117)
Bilirubin, Direct: 0.3 mg/dL (ref 0.0–0.3)
Total Bilirubin: 2.3 mg/dL — ABNORMAL HIGH (ref 0.2–1.2)
Total Protein: 7.7 g/dL (ref 6.0–8.3)

## 2021-09-16 LAB — IRON: Iron: 98 ug/dL (ref 42–165)

## 2021-09-16 LAB — FERRITIN: Ferritin: 239.4 ng/mL (ref 22.0–322.0)

## 2021-09-16 NOTE — Progress Notes (Signed)
Agree with assessment and plan as outlined.  

## 2021-09-16 NOTE — Patient Instructions (Addendum)
IMAGING: You will be contacted by [facility] Scheduling (Your caller ID will indicate phone # [phone]) in the next 7 days to schedule your abdominal ultrasound. If you have not heard from them within 7 business days, please call [facility] Scheduling at [phone] to follow up on the status of your appointment.    LABS:  Lab work has been ordered for you today. Our lab is located in the basement. Press "B" on the elevator. The lab is located at the first door on the left as you exit the elevator.  HEALTHCARE LAWS AND MY CHART RESULTS: Due to recent changes in healthcare laws, you may see the results of your imaging and laboratory studies on MyChart before your provider has had a chance to review them.   We understand that in some cases there may be results that are confusing or concerning to you. Not all laboratory results come back in the same time frame and the provider may be waiting for multiple results in order to interpret others.  Please give [place] 48 hours in order for your provider to thoroughly review all the results before contacting the office for clarification of your results.   Further follow up to be determined after ultrasound and lab results are reviewed.  It was great seeing you today! Thank you for entrusting me with your care and choosing [facility].  Arnaldo Natal, CRNP  The [place] [facility] providers would like to encourage you to use [facility] to communicate with providers for non-urgent requests or questions.  Due to long hold times on the telephone, sending your provider a message by [facility] may be faster and more efficient way to get a response. Please allow 48 business hours for a response.  Please remember that this is for non-urgent requests/questions.  If you are age 33 or older, your body mass index should be between 23-30. Your Body mass index is 26.26 kg/m. If this is out of the aforementioned range listed, please consider  follow up with your Primary Care Provider.  If you are age 48 or younger, your body mass index should be between 19-25. Your Body mass index is 26.26 kg/m. If this is out of the aformentioned range listed, please consider follow up with your Primary Care Provider.

## 2021-09-16 NOTE — Progress Notes (Signed)
RADIOLOGY SCHEDULING REQUEST FOR RUQ US °[facility] Scheduling via secure staff message. ° ° ° °

## 2021-09-20 LAB — CERULOPLASMIN: Ceruloplasmin: 32 mg/dL (ref 18–36)

## 2021-09-20 LAB — ANTI-SMOOTH MUSCLE ANTIBODY, IGG: Actin (Smooth Muscle) Antibody (IGG): <20 U (ref <20)

## 2021-09-20 LAB — IGG: IgG (Immunoglobin G), Serum: 1086 mg/dL (ref 600–1640)

## 2021-09-20 LAB — HEPATITIS A ANTIBODY, TOTAL: Hepatitis A AB,Total: REACTIVE — AB

## 2021-09-20 LAB — HEPATITIS B CORE ANTIBODY, TOTAL: Hep B Core Total Ab: NONREACTIVE

## 2021-09-20 LAB — ANA: Anti Nuclear Antibody (ANA): NEGATIVE

## 2021-09-20 LAB — ALPHA-1-ANTITRYPSIN: A-1 Antitrypsin, Ser: 148 mg/dL (ref 83–199)

## 2021-10-07 ENCOUNTER — Ambulatory Visit (HOSPITAL_COMMUNITY)
Admission: RE | Admit: 2021-10-07 | Discharge: 2021-10-07 | Disposition: A | Discharge status: Home / Self Care | Payer: BC Managed Care – PPO | Source: Ambulatory Visit | Attending: Nurse Practitioner | Admitting: Nurse Practitioner

## 2021-10-07 DIAGNOSIS — R7989 Other specified abnormal findings of blood chemistry: Secondary | ICD-10-CM | POA: Diagnosis not present

## 2021-10-07 DIAGNOSIS — K8020 Calculus of gallbladder without cholecystitis without obstruction: Secondary | ICD-10-CM | POA: Diagnosis not present

## 2021-10-24 DIAGNOSIS — H6123 Impacted cerumen, bilateral: Secondary | ICD-10-CM | POA: Diagnosis not present

## 2021-11-19 ENCOUNTER — Other Ambulatory Visit (INDEPENDENT_AMBULATORY_CARE_PROVIDER_SITE_OTHER): Payer: BC Managed Care – PPO

## 2021-11-19 ENCOUNTER — Other Ambulatory Visit: Payer: Self-pay

## 2021-11-19 DIAGNOSIS — R7989 Other specified abnormal findings of blood chemistry: Secondary | ICD-10-CM | POA: Diagnosis not present

## 2021-11-19 LAB — HEPATIC FUNCTION PANEL
ALT: 50 U/L (ref 0–53)
AST: 28 U/L (ref 0–37)
Albumin: 4.4 g/dL (ref 3.5–5.2)
Alkaline Phosphatase: 100 U/L (ref 39–117)
Bilirubin, Direct: 0.2 mg/dL (ref 0.0–0.3)
Total Bilirubin: 1.8 mg/dL — ABNORMAL HIGH (ref 0.2–1.2)
Total Protein: 7.2 g/dL (ref 6.0–8.3)

## 2021-12-10 ENCOUNTER — Encounter: Payer: Self-pay | Admitting: Gastroenterology

## 2021-12-10 ENCOUNTER — Ambulatory Visit: Payer: BC Managed Care – PPO | Admitting: Gastroenterology

## 2021-12-10 VITALS — BP 110/70 | HR 110 | Ht 66.0 in | Wt 156.0 lb

## 2021-12-10 DIAGNOSIS — R748 Abnormal levels of other serum enzymes: Secondary | ICD-10-CM

## 2021-12-10 DIAGNOSIS — K760 Fatty (change of) liver, not elsewhere classified: Secondary | ICD-10-CM

## 2021-12-10 DIAGNOSIS — K8020 Calculus of gallbladder without cholecystitis without obstruction: Secondary | ICD-10-CM | POA: Diagnosis not present

## 2021-12-10 NOTE — Progress Notes (Signed)
HPI :  41 year old male here for a follow-up visit for elevated liver enzymes and fatty liver.  Recall he was seen in November 2022 by Carl Best for further evaluation of liver enzymes.  On routine physical in October he had an ALT of 100, AST of 47, normal alk phos.  This was repeated in November with an ALT of 90, AST 45, bili of 2.3 but mostly indirect.  Since his last visit he had an ultrasound of his right upper quadrant which showed some tiny gallstones and hepatic steatosis.  He had a serologic work-up with labs which was negative for viral hepatitis, autoimmune hepatitis, alpha 1 antitrypsin, Wilson's, hemochromatosis, etc.  He was noted to be immune to hepatitis A and B.  He denies any medications that he takes routinely or supplements.  No routine NSAID use.  He denies any routine alcohol use, uses seldomly only on the holidays.  He states with COVID he gained about 20 pounds upwards of 172, has since lost weight now down to 150s.  He states since he was told he had fatty liver he has changed his diet significantly.  He used to eat a lot of fatty foods and microwavable meals, he stopped doing that and has been eating much healthier.  With this he is lost some weight.  His liver enzymes at the end of January showed improvement with an ALT of 50, AST 28, T. bili 1.8, again mostly indirect.  He works as a Psychiatrist, works from home.  We discussed his course to date and plan moving forward.  He otherwise denies any abdominal pain, no postprandial pain in his right upper side when he eats.  Sounds like he is never had symptomatic gallstones.  He has had a hemoglobin A1c which is normal.  RUQ [place] 10/07/21: IMPRESSION: 1. Tiny gallstones. No evidence of cholecystitis or biliary distention.   2. Liver is echogenic consistent fatty infiltration and or hepatocellular disease. Left hepatic lobe difficult to visualize due to overlying bowel gas. No focal hepatic abnormality  identified.   3 months ago - ALT 100, AST 47, AP 127, T bil 1.6 2 months ago - ALT 90, AST 45, AP 107, T bil 2.3 - direct bili 0.3 3 weeks ago - ALT 50, AST 28, AP 100, T bil 1.8 - direct bili 0.2   Immune to both hep A and B, lab workup otherwise negative A1c - 5.2      Past Medical History:  Diagnosis Date   Fatty liver    Hepatitis    as a child     Past Surgical History:  Procedure Laterality Date   NO PAST SURGERIES     Family History  Problem Relation Age of Onset   Multiple myeloma Mother    Social History   Tobacco Use   Smoking status: Never   Smokeless tobacco: Never  Vaping Use   Vaping Use: Never used  Substance Use Topics   Alcohol use: Not Currently   Drug use: Never   No current outpatient medications on file.   No current facility-administered medications for this visit.   No Known Allergies   Review of Systems: All systems reviewed and negative except where noted in HPI.    Lab Results  Component Value Date   ALT 50 11/19/2021   AST 28 11/19/2021   GGT 43 08/23/2021   ALKPHOS 100 11/19/2021   BILITOT 1.8 (H) 11/19/2021    Lab Results  Component Value  Date   WBC 5.2 08/20/2021   HGB 16.5 08/20/2021   HCT 49.0 08/20/2021   MCV 93 08/20/2021   PLT 211 08/20/2021   Lab Results  Component Value Date   CREATININE 0.88 08/20/2021   BUN 12 08/20/2021   NA 142 08/20/2021   K 4.2 08/20/2021   CL 105 08/20/2021   CO2 21 08/20/2021     Physical Exam: BP 110/70    Pulse (!) 110    Ht 5' 6"  (1.676 m)    Wt 156 lb (70.8 kg)    BMI 25.18 kg/m  Constitutional: Pleasant,well-developed, male in no acute distress. HEENT: Normocephalic and atraumatic. Conjunctivae are normal. No scleral icterus. Neurological: Alert and oriented to person place and time. Psychiatric: Normal mood and affect. Behavior is normal.   ASSESSMENT AND PLAN: 41 year old male here for reassessment the following:  Fatty liver Gallstones  Patient presented  previously with elevated liver enzymes, serologic work-up done as well as right upper quadrant ultrasound has yielded a diagnosis of fatty liver.  His body mass index is currently 25, he does not drink alcohol.  He had gained about 20 pounds with COVID and has since worked to eat better and lose weight since he found out about the fatty liver.  With change in diet and some weight loss his ALT has been cut in half which is excellent.  We discussed fatty liver in general, risks for NASH and cirrhosis.  He does not have diabetes.  He will continue to work on lifestyle measures with his diet, and routine exercise.  I would repeat LFTs in 6 months at this point.  He should follow-up with our office yearly moving forward.  He is immune to hep A and B.  Otherwise we discussed gallstones, risks for biliary colic and choledocholithiasis and pancreatitis.  He is asymptomatic at this time, will monitor for recurrence of symptoms in the future.  Jolly Mango, MD [facility]

## 2021-12-10 NOTE — Patient Instructions (Signed)
If you are age 43 or older, your body mass index should be between 23-30. Your Body mass index is 25.18 kg/m. If this is out of the aforementioned range listed, please consider follow up with your Primary Care Provider.  If you are age 73 or younger, your body mass index should be between 19-25. Your Body mass index is 25.18 kg/m. If this is out of the aformentioned range listed, please consider follow up with your Primary Care Provider.   ________________________________________________________  The [facility] providers would like to encourage you to use [facility] to communicate with providers for non-urgent requests or questions.  Due to long hold times on the telephone, sending your provider a message by [facility] may be a faster and more efficient way to get a response.  Please allow 48 business hours for a response.  Please remember that this is for non-urgent requests.  _______________________________________________________  Martin Hutchinson will be due for labs (LFTs) in 6 months.  Please follow up in 1 year.  Thank you for entrusting me with your care and for choosing [facility], Dr. Ileene Patrick

## 2022-06-03 ENCOUNTER — Telehealth: Payer: Self-pay

## 2022-06-03 DIAGNOSIS — R748 Abnormal levels of other serum enzymes: Secondary | ICD-10-CM

## 2022-06-03 DIAGNOSIS — K760 Fatty (change of) liver, not elsewhere classified: Secondary | ICD-10-CM

## 2022-06-03 NOTE — Telephone Encounter (Signed)
Order entered. MyChart message sent to pt

## 2022-06-03 NOTE — Telephone Encounter (Signed)
-----   Message from Cooper Render, CMA sent at 12/10/2021  9:12 AM EST ----- Regarding: due for labs in August Patient due for LFTs in mid August

## 2022-08-15 IMAGING — US US ABDOMEN LIMITED
1 series · 14 of 25 positions shown · non-contrast
Comparison: No prior.

CLINICAL DATA: Elevated LFTs.

EXAM:
ULTRASOUND ABDOMEN LIMITED RIGHT UPPER QUADRANT

[Series 1: us abdomen limited · 14 of 61 slices shown]
[im 1/61]
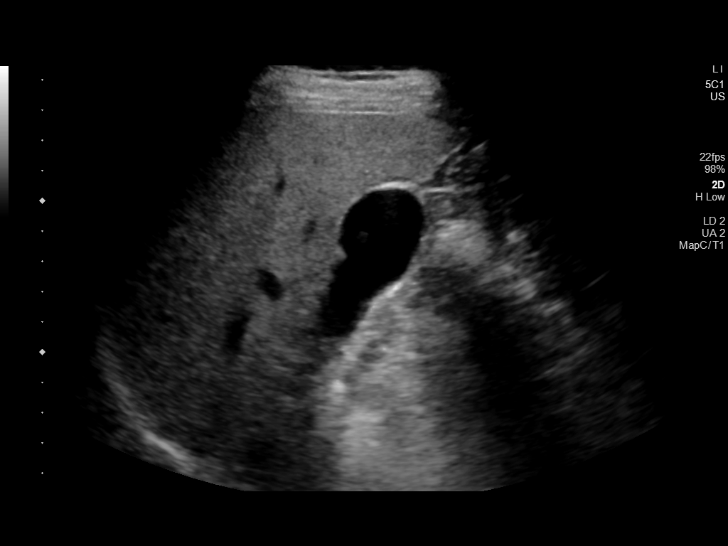
[im 6/61]
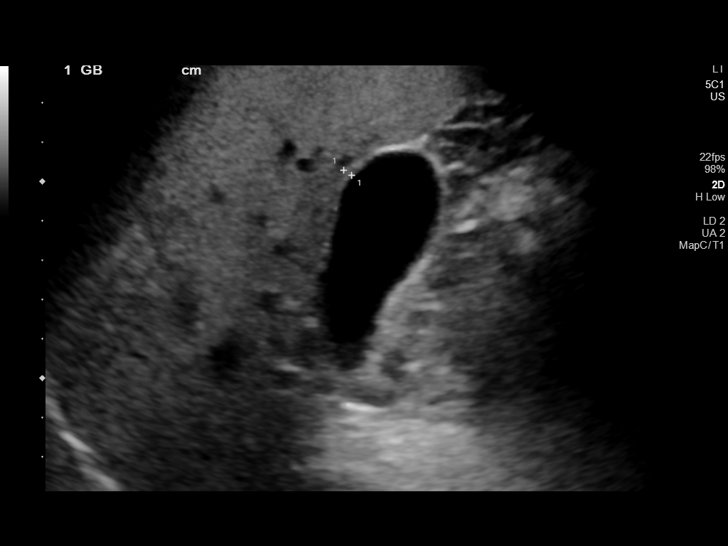
[im 11/61]
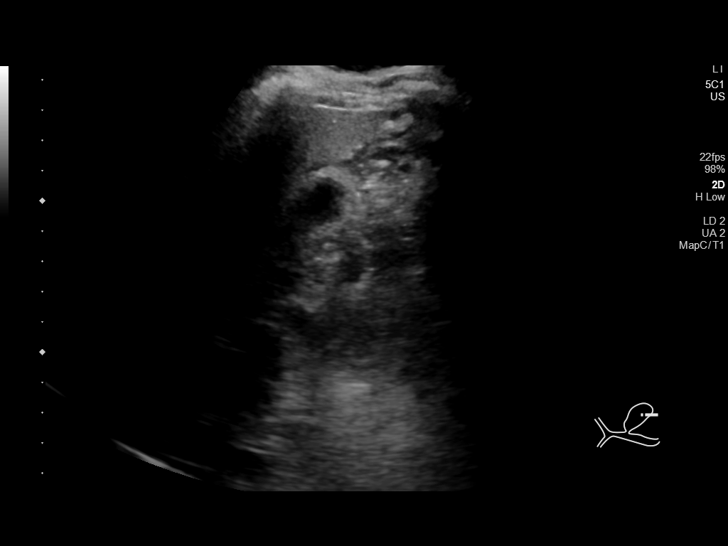
[im 16/61]
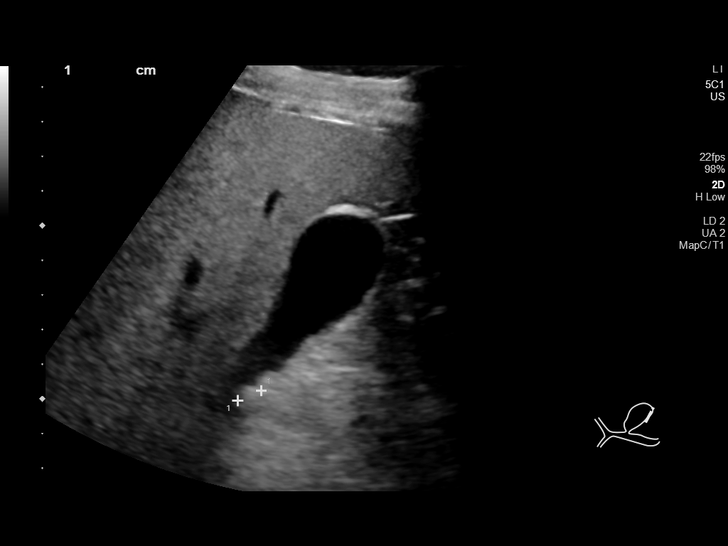
[im 21/61]
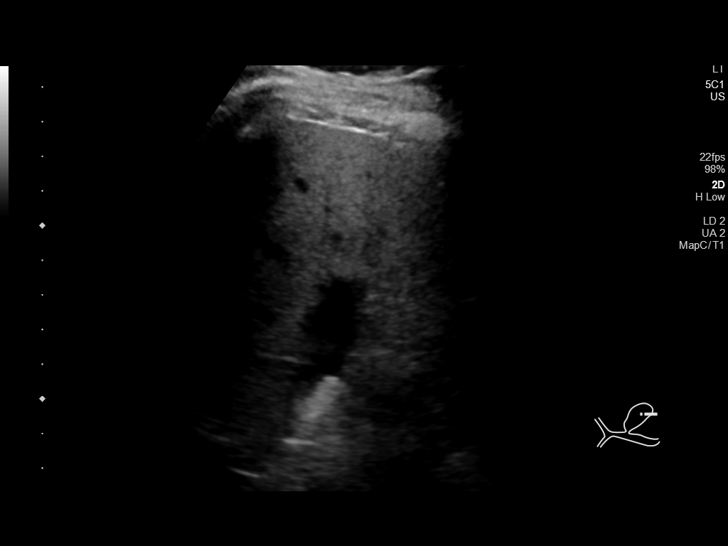
[im 23/61]
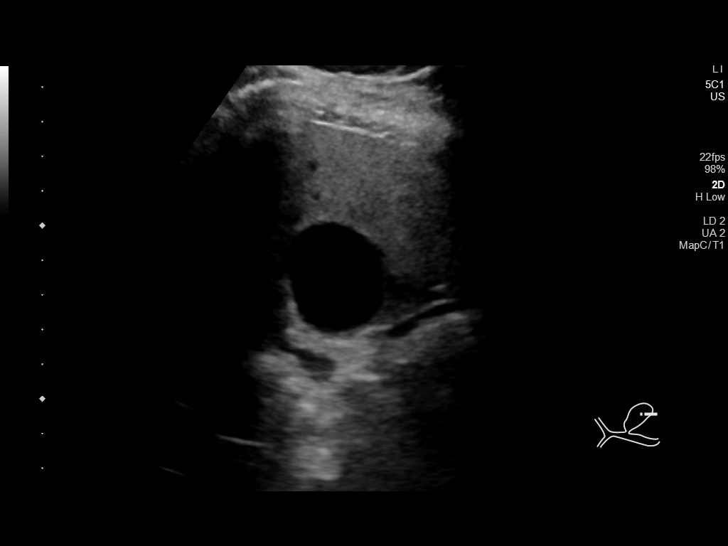
[im 28/61]
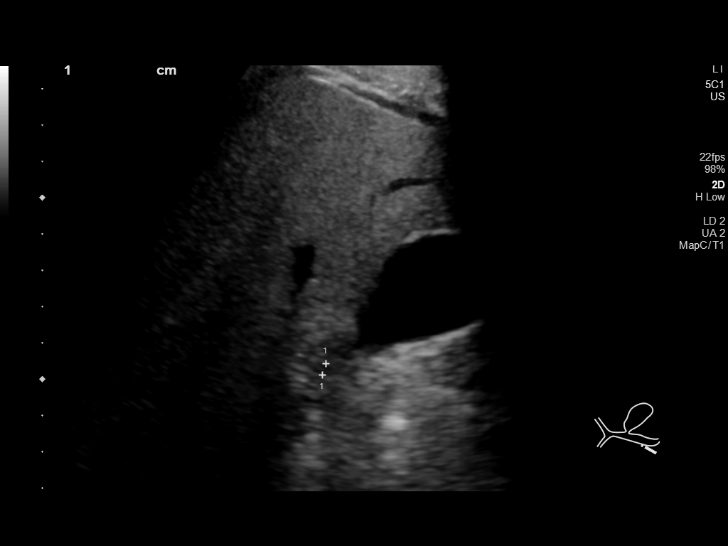
[im 33/61]
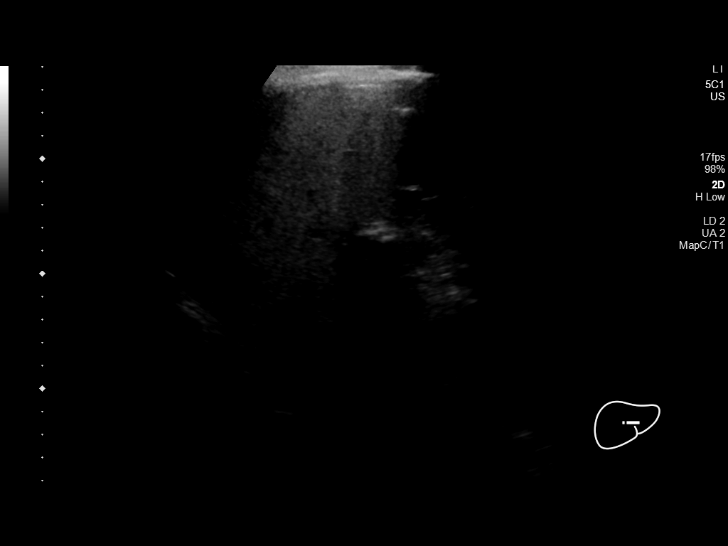
[im 38/61]
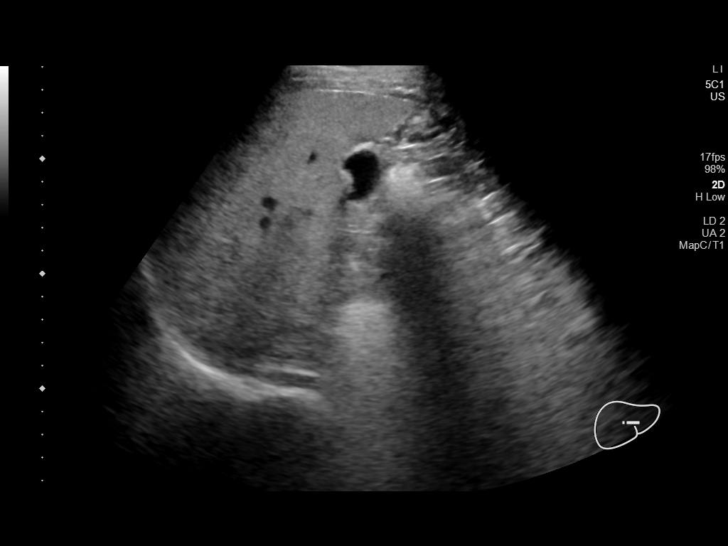
[im 41/61]
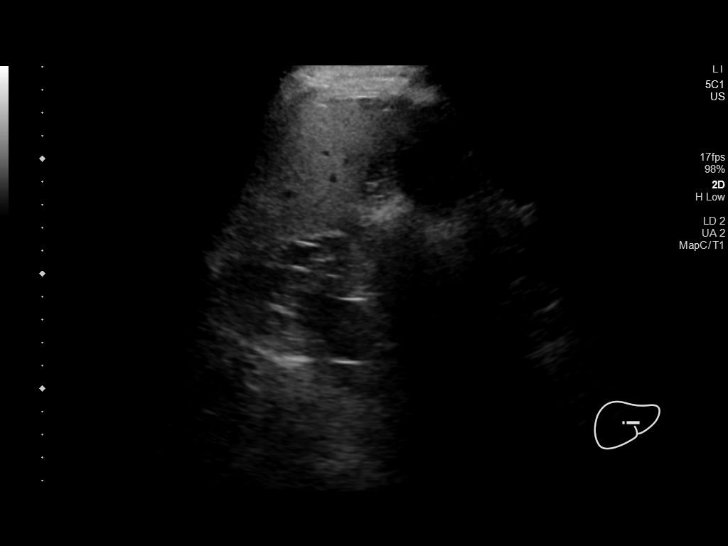
[im 46/61]
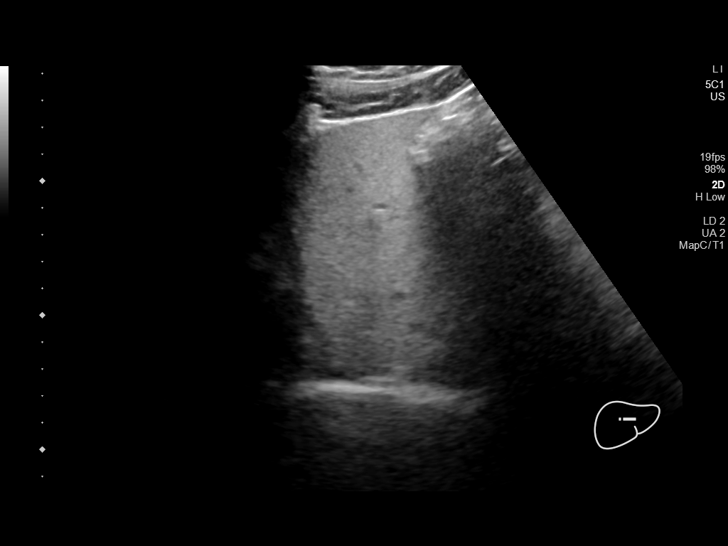
[im 51/61]
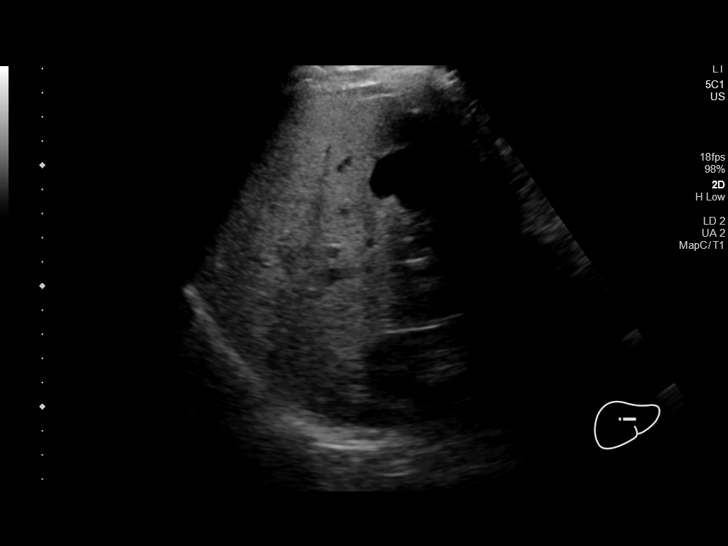
[im 56/61]
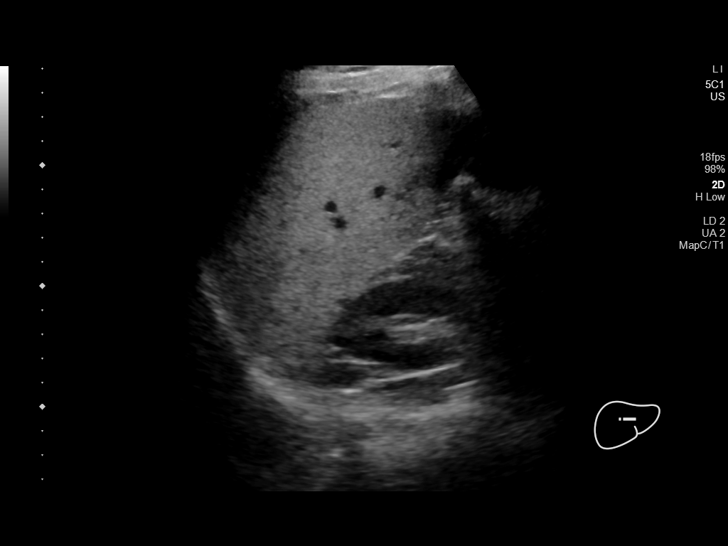
[im 61/61]
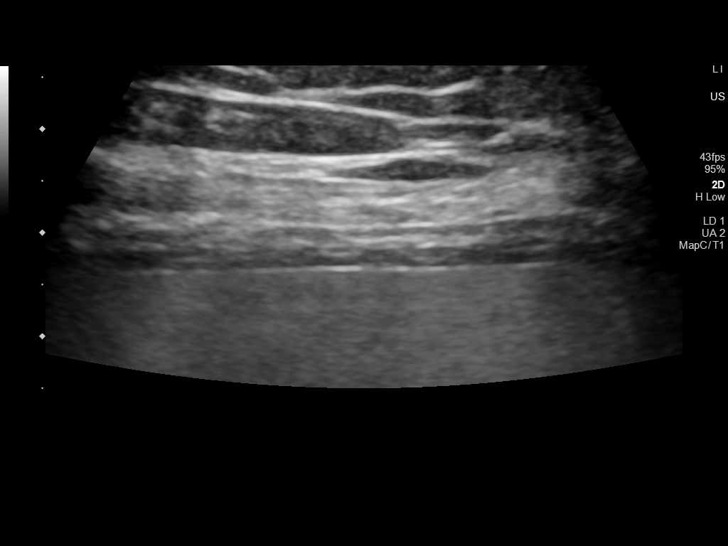

[14 of 25 positions shown; findings below may reference images not displayed]

FINDINGS: Gallbladder:

Tiny mobile echogenic densities consistent with gallstones, the
largest measuring 7.4 mm. Gallbladder wall thickness normal.
Negative Murphy sign.

Common bile duct:

Diameter: 3.3 mm

Liver:

Liver is echogenic consistent with fatty infiltration and or
hepatocellular disease. Left hepatic lobe difficult to visualize due
to overlying bowel gas. No focal hepatic abnormality identified.
Portal vein is patent on color Doppler imaging with normal direction
of blood flow towards the liver.

Other: None.
IMPRESSION: 1. Tiny gallstones. No evidence of cholecystitis or biliary
distention.

2. Liver is echogenic consistent fatty infiltration and or
hepatocellular disease. Left hepatic lobe difficult to visualize due
to overlying bowel gas. No focal hepatic abnormality identified.

## 2022-11-20 ENCOUNTER — Encounter: Payer: Self-pay | Admitting: Gastroenterology
